# Patient Record
Sex: Female | Born: 1935 | Race: White | Hispanic: No
Health system: Southern US, Community
[De-identification: ages and names within clinical notes are randomized; demographics above are authoritative.]

---

## 1998-11-28 ENCOUNTER — Other Ambulatory Visit: Admission: RE | Admit: 1998-11-28 | Discharge: 1998-11-28 | Payer: Self-pay | Admitting: Family Medicine

## 1998-12-17 ENCOUNTER — Ambulatory Visit (HOSPITAL_COMMUNITY): Admission: RE | Admit: 1998-12-17 | Discharge: 1998-12-17 | Payer: Self-pay | Admitting: Gastroenterology

## 2000-11-07 ENCOUNTER — Other Ambulatory Visit: Admission: RE | Admit: 2000-11-07 | Discharge: 2000-11-07 | Payer: Self-pay | Admitting: Family Medicine

## 2005-01-18 ENCOUNTER — Emergency Department (HOSPITAL_COMMUNITY): Admission: EM | Admit: 2005-01-18 | Discharge: 2005-01-19 | Payer: Self-pay | Admitting: Emergency Medicine

## 2005-08-20 IMAGING — CR DG SHOULDER 2+V*R*
2 series · 2 of 2 positions shown · non-contrast
Comparison: none

CLINICAL DATA: Status post reduction right shoulder dislocation

RIGHT SHOULDER - 2 VIEW

[view not recorded (1 of 2)]
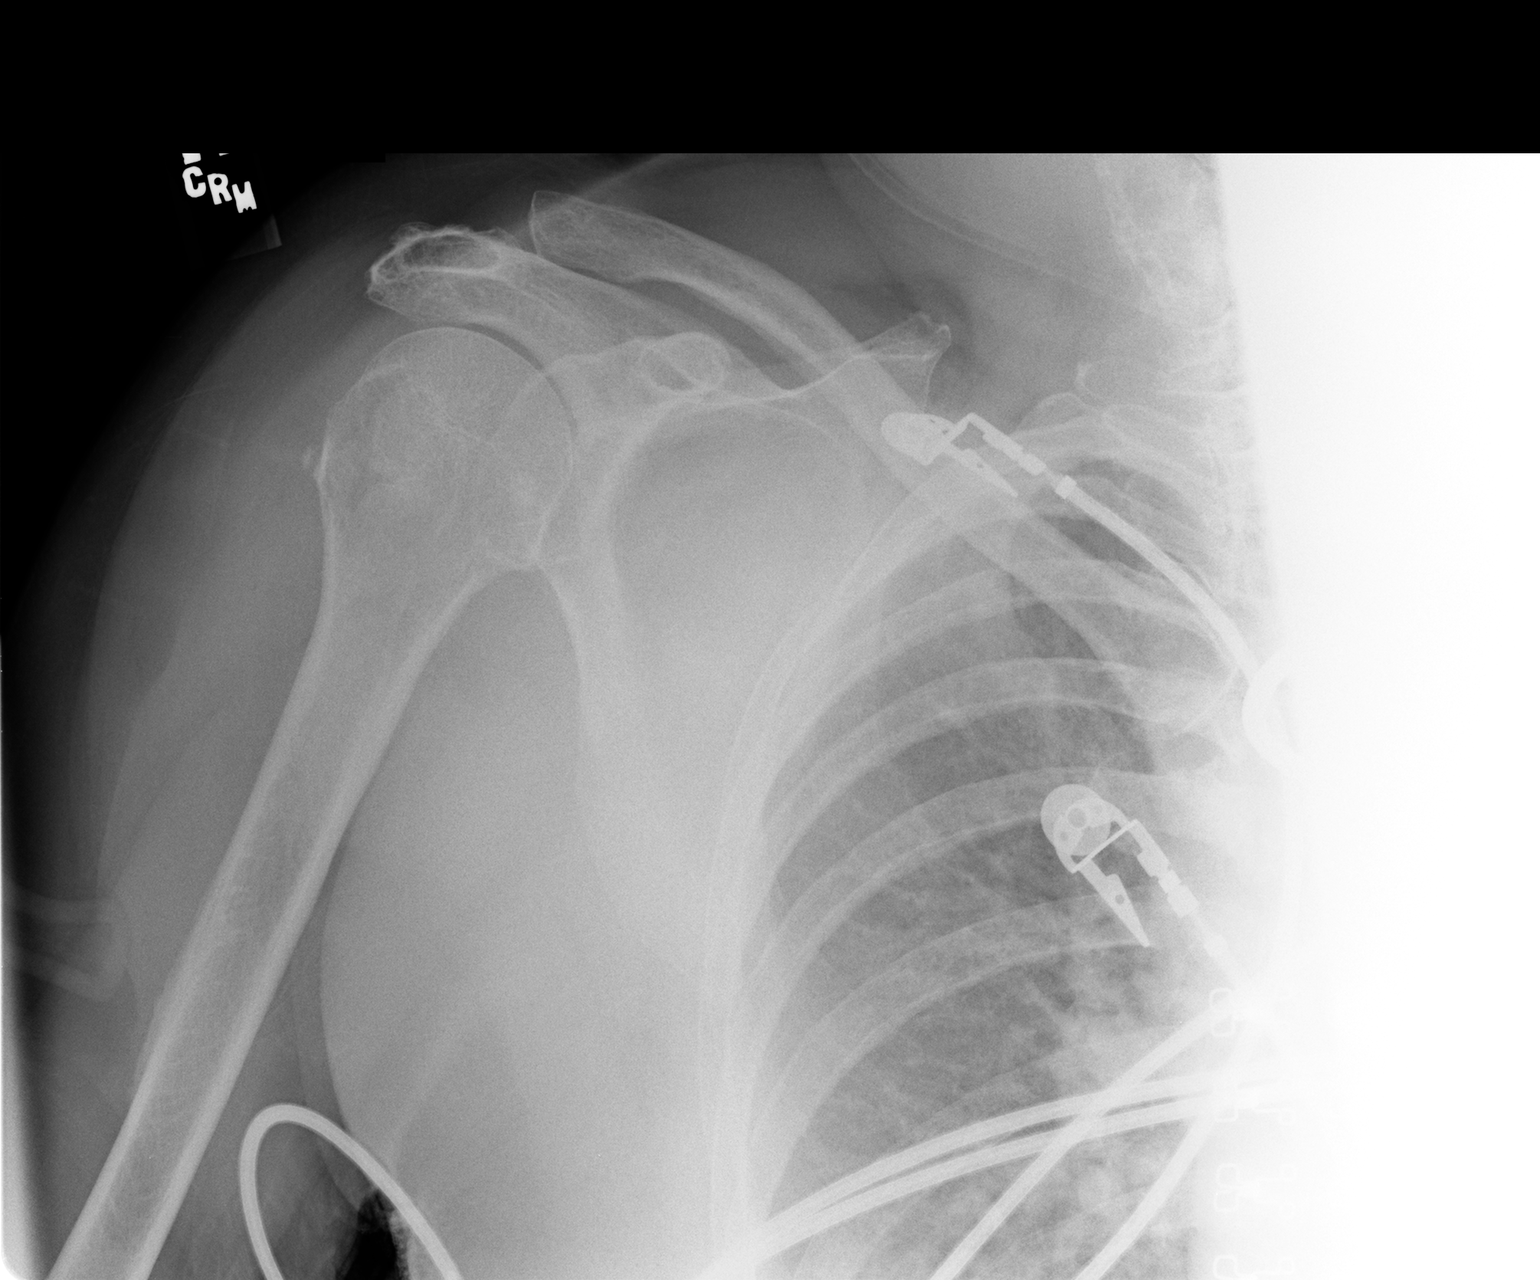

[view not recorded (2 of 2)]
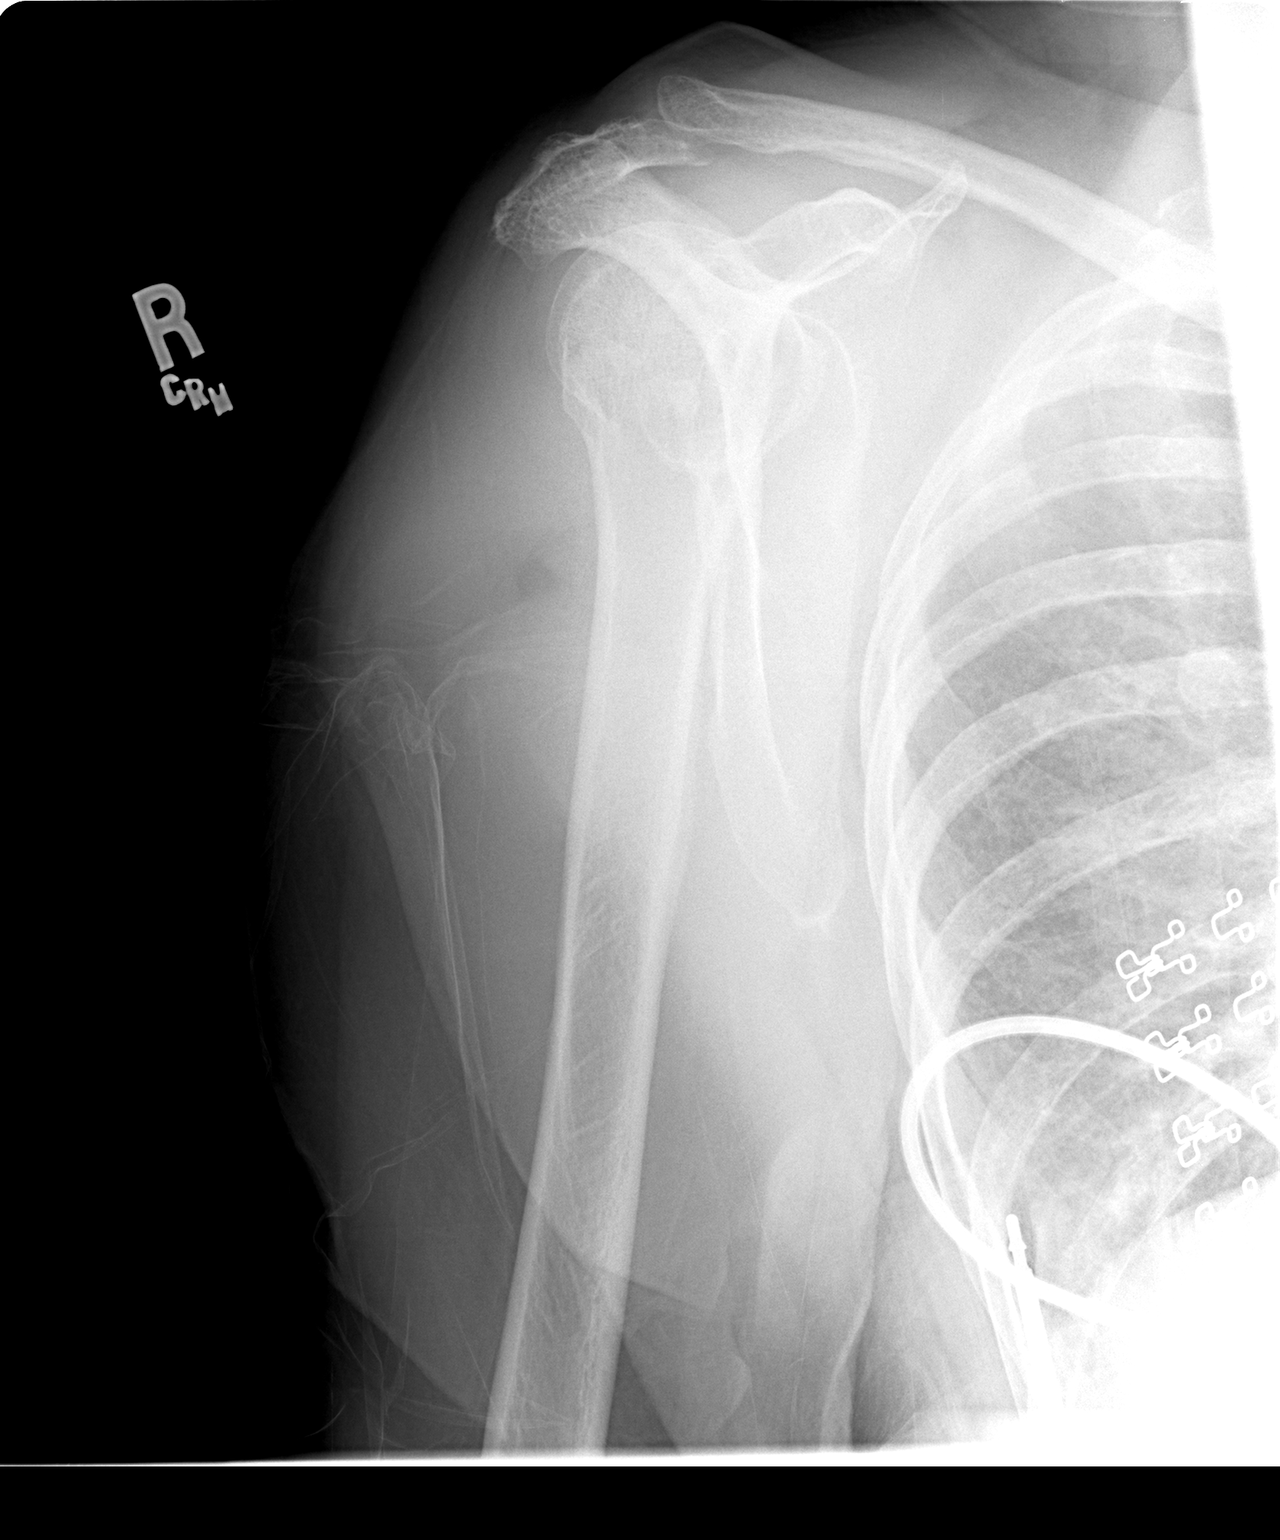

[2 of 2 positions shown; findings below may reference images not displayed]

FINDINGS: There is minimal reduction of the anterior right shoulder
dislocation. Small avulsion fragment is noted off the greater tuberosity.
Hill-Sachs deformity noted.

IMPRESSION

Interval reduction. Small avulsion fragment of greater tuberosity with
Hill-Sachs deformity.

## 2014-01-17 ENCOUNTER — Other Ambulatory Visit: Payer: Self-pay | Admitting: Gastroenterology

## 2017-03-24 ENCOUNTER — Other Ambulatory Visit: Payer: Self-pay | Admitting: Family Medicine

## 2017-03-24 DIAGNOSIS — E2839 Other primary ovarian failure: Secondary | ICD-10-CM

## 2017-04-05 ENCOUNTER — Inpatient Hospital Stay
Admission: RE | Admit: 2017-04-05 | Discharge: 2017-04-05 | Disposition: A | Discharge status: Home / Self Care | Payer: Self-pay | Source: Ambulatory Visit | Attending: Family Medicine | Admitting: Family Medicine

## 2019-11-22 ENCOUNTER — Ambulatory Visit: Payer: Self-pay

## 2019-12-01 ENCOUNTER — Ambulatory Visit: Payer: Medicare Other | Attending: Internal Medicine

## 2019-12-01 DIAGNOSIS — Z23 Encounter for immunization: Secondary | ICD-10-CM | POA: Insufficient documentation

## 2019-12-01 NOTE — Progress Notes (Signed)
   Covid-19 Vaccination Clinic  Name:  Mary Rowland    MRN: [id number] DOB: 01/13/1935  12/01/2019  Mary Rowland was observed post Covid-19 immunization for 15 minutes without incidence. She was provided with Vaccine Information Sheet and instruction to access the V-Safe system.   Mary Rowland was instructed to call 911 with any severe reactions post vaccine: Marland Kitchen Difficulty breathing  . Swelling of your face and throat  . A fast heartbeat  . A bad rash all over your body  . Dizziness and weakness    Immunizations Administered    Name Date Dose VIS Date Route   Pfizer COVID-19 Vaccine 12/01/2019  1:46 PM 0.3 mL 10/05/2019 Intramuscular   Manufacturer: [employer], [employer]   Lot: [id number]   NDC: [id number]

## 2019-12-26 ENCOUNTER — Ambulatory Visit: Payer: Medicare Other | Attending: Internal Medicine

## 2019-12-26 DIAGNOSIS — Z23 Encounter for immunization: Secondary | ICD-10-CM | POA: Insufficient documentation

## 2019-12-26 NOTE — Progress Notes (Signed)
   Covid-19 Vaccination Clinic  Name:  Mary Rowland    MRN: [id number] DOB: Jan 22, 1935  12/26/2019  Mary Rowland was observed post Covid-19 immunization for 15 minutes without incident. She was provided with Vaccine Information Sheet and instruction to access the V-Safe system.   Mary Rowland was instructed to call 911 with any severe reactions post vaccine: Marland Kitchen Difficulty breathing  . Swelling of face and throat  . A fast heartbeat  . A bad rash all over body  . Dizziness and weakness   Immunizations Administered    Name Date Dose VIS Date Route   Pfizer COVID-19 Vaccine 12/26/2019  9:20 AM 0.3 mL 10/05/2019 Intramuscular   Manufacturer: [employer], [employer]   Lot: [id number]   NDC: [address]-1
